# Patient Record
Sex: Male | Born: 1995 | Hispanic: Yes | Marital: Single
Health system: Southern US, Community
[De-identification: ages and names within clinical notes are randomized; demographics above are authoritative.]

## PROBLEM LIST (undated history)

## (undated) ENCOUNTER — Emergency Department (HOSPITAL_COMMUNITY): Admission: EM | Payer: Self-pay | Source: Home / Self Care

---

## 2020-04-16 ENCOUNTER — Emergency Department (HOSPITAL_COMMUNITY): Payer: Self-pay

## 2020-04-16 ENCOUNTER — Other Ambulatory Visit: Payer: Self-pay

## 2020-04-16 ENCOUNTER — Inpatient Hospital Stay (HOSPITAL_COMMUNITY)
Admission: EM | Admit: 2020-04-16 | Discharge: 2020-04-19 | DRG: 552 | Disposition: A | Discharge status: Home / Self Care | Payer: Self-pay | Attending: General Surgery | Admitting: General Surgery

## 2020-04-16 DIAGNOSIS — Y9269 Other specified industrial and construction area as the place of occurrence of the external cause: Secondary | ICD-10-CM

## 2020-04-16 DIAGNOSIS — S129XXA Fracture of neck, unspecified, initial encounter: Secondary | ICD-10-CM | POA: Diagnosis present

## 2020-04-16 DIAGNOSIS — Y929 Unspecified place or not applicable: Secondary | ICD-10-CM

## 2020-04-16 DIAGNOSIS — S0012XA Contusion of left eyelid and periocular area, initial encounter: Secondary | ICD-10-CM | POA: Diagnosis present

## 2020-04-16 DIAGNOSIS — S0230XA Fracture of orbital floor, unspecified side, initial encounter for closed fracture: Secondary | ICD-10-CM

## 2020-04-16 DIAGNOSIS — S0231XA Fracture of orbital floor, right side, initial encounter for closed fracture: Secondary | ICD-10-CM | POA: Diagnosis present

## 2020-04-16 DIAGNOSIS — S22039A Unspecified fracture of third thoracic vertebra, initial encounter for closed fracture: Secondary | ICD-10-CM | POA: Diagnosis present

## 2020-04-16 DIAGNOSIS — S12200A Unspecified displaced fracture of third cervical vertebra, initial encounter for closed fracture: Secondary | ICD-10-CM | POA: Diagnosis present

## 2020-04-16 DIAGNOSIS — J939 Pneumothorax, unspecified: Secondary | ICD-10-CM

## 2020-04-16 DIAGNOSIS — T1490XA Injury, unspecified, initial encounter: Secondary | ICD-10-CM

## 2020-04-16 DIAGNOSIS — S270XXA Traumatic pneumothorax, initial encounter: Secondary | ICD-10-CM | POA: Diagnosis present

## 2020-04-16 DIAGNOSIS — Y906 Blood alcohol level of 120-199 mg/100 ml: Secondary | ICD-10-CM | POA: Diagnosis present

## 2020-04-16 DIAGNOSIS — Z20822 Contact with and (suspected) exposure to covid-19: Secondary | ICD-10-CM | POA: Diagnosis present

## 2020-04-16 DIAGNOSIS — S22049A Unspecified fracture of fourth thoracic vertebra, initial encounter for closed fracture: Secondary | ICD-10-CM | POA: Diagnosis present

## 2020-04-16 DIAGNOSIS — S12000A Unspecified displaced fracture of first cervical vertebra, initial encounter for closed fracture: Principal | ICD-10-CM | POA: Diagnosis present

## 2020-04-16 DIAGNOSIS — S27322A Contusion of lung, bilateral, initial encounter: Secondary | ICD-10-CM | POA: Diagnosis present

## 2020-04-16 DIAGNOSIS — S22029A Unspecified fracture of second thoracic vertebra, initial encounter for closed fracture: Secondary | ICD-10-CM | POA: Diagnosis present

## 2020-04-16 DIAGNOSIS — S12100A Unspecified displaced fracture of second cervical vertebra, initial encounter for closed fracture: Secondary | ICD-10-CM | POA: Diagnosis present

## 2020-04-16 DIAGNOSIS — S02101A Fracture of base of skull, right side, initial encounter for closed fracture: Secondary | ICD-10-CM | POA: Diagnosis present

## 2020-04-16 DIAGNOSIS — F1010 Alcohol abuse, uncomplicated: Secondary | ICD-10-CM | POA: Diagnosis present

## 2020-04-16 LAB — COMPREHENSIVE METABOLIC PANEL
ALT: 30 U/L (ref 0–44)
AST: 74 U/L — ABNORMAL HIGH (ref 15–41)
Albumin: 4.4 g/dL (ref 3.5–5.0)
Alkaline Phosphatase: 74 U/L (ref 38–126)
Anion gap: 9 (ref 5–15)
BUN: 8 mg/dL (ref 6–20)
CO2: 23 mmol/L (ref 22–32)
Calcium: 8.6 mg/dL — ABNORMAL LOW (ref 8.9–10.3)
Chloride: 106 mmol/L (ref 98–111)
Creatinine, Ser: 0.96 mg/dL (ref 0.61–1.24)
GFR, Estimated: >60 mL/min (ref >60)
Glucose, Bld: 112 mg/dL — ABNORMAL HIGH (ref 70–99)
Potassium: 3.5 mmol/L (ref 3.5–5.1)
Sodium: 138 mmol/L (ref 135–145)
Total Bilirubin: 1.1 mg/dL (ref 0.3–1.2)
Total Protein: 7.1 g/dL (ref 6.5–8.1)

## 2020-04-16 LAB — CBC
HCT: 42.8 % (ref 39.0–52.0)
Hemoglobin: 14.7 g/dL (ref 13.0–17.0)
MCH: 33.3 pg (ref 26.0–34.0)
MCHC: 34.3 g/dL (ref 30.0–36.0)
MCV: 97.1 fL (ref 80.0–100.0)
Platelets: 262 10*3/uL (ref 150–400)
RBC: 4.41 MIL/uL (ref 4.22–5.81)
RDW: 12.4 % (ref 11.5–15.5)
WBC: 20 10*3/uL — ABNORMAL HIGH (ref 4.0–10.5)
nRBC: 0 % (ref 0.0–0.2)

## 2020-04-16 LAB — I-STAT CHEM 8, ED
BUN: 10 mg/dL (ref 6–20)
Calcium, Ion: 1.08 mmol/L — ABNORMAL LOW (ref 1.15–1.40)
Chloride: 106 mmol/L (ref 98–111)
Creatinine, Ser: 1 mg/dL (ref 0.61–1.24)
Glucose, Bld: 108 mg/dL — ABNORMAL HIGH (ref 70–99)
HCT: 42 % (ref 39.0–52.0)
Hemoglobin: 14.3 g/dL (ref 13.0–17.0)
Potassium: 3.8 mmol/L (ref 3.5–5.1)
Sodium: 142 mmol/L (ref 135–145)
TCO2: 24 mmol/L (ref 22–32)

## 2020-04-16 LAB — RESP PANEL BY RT-PCR (FLU A&B, COVID) ARPGX2
Influenza A by PCR: NEGATIVE
Influenza B by PCR: NEGATIVE
SARS Coronavirus 2 by RT PCR: NEGATIVE

## 2020-04-16 LAB — ETHANOL: Alcohol, Ethyl (B): 153 mg/dL — ABNORMAL HIGH (ref <10)

## 2020-04-16 LAB — PROTIME-INR
INR: 1 (ref 0.8–1.2)
Prothrombin Time: 12.9 seconds (ref 11.4–15.2)

## 2020-04-16 LAB — SAMPLE TO BLOOD BANK
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
## 2020-04-16 MED ORDER — FENTANYL CITRATE (PF) 100 MCG/2ML IJ SOLN
50.0000 ug | Freq: Once | INTRAMUSCULAR | Status: AC
  Administered 2020-04-16: 50 ug via INTRAVENOUS
  Filled 2020-04-16: qty 2

## 2020-04-16 MED ORDER — IOHEXOL 300 MG/ML  SOLN
100.0000 mL | Freq: Once | INTRAMUSCULAR | Status: AC | PRN
  Administered 2020-04-16: 100 mL via INTRAVENOUS

## 2020-04-16 MED ORDER — ONDANSETRON HCL 4 MG/2ML IJ SOLN
4.0000 mg | Freq: Once | INTRAMUSCULAR | Status: AC
  Administered 2020-04-16: 4 mg via INTRAVENOUS
  Filled 2020-04-16: qty 2

## 2020-04-16 NOTE — ED Notes (Signed)
Patient transported to CT 
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
## 2020-04-17 ENCOUNTER — Inpatient Hospital Stay (HOSPITAL_COMMUNITY): Payer: Self-pay

## 2020-04-17 ENCOUNTER — Emergency Department (HOSPITAL_COMMUNITY): Payer: Self-pay

## 2020-04-17 DIAGNOSIS — S129XXA Fracture of neck, unspecified, initial encounter: Secondary | ICD-10-CM | POA: Diagnosis present

## 2020-04-17 DIAGNOSIS — S0230XA Fracture of orbital floor, unspecified side, initial encounter for closed fracture: Secondary | ICD-10-CM

## 2020-04-17 LAB — BASIC METABOLIC PANEL
Anion gap: 9 (ref 5–15)
BUN: 8 mg/dL (ref 6–20)
CO2: 26 mmol/L (ref 22–32)
Calcium: 9 mg/dL (ref 8.9–10.3)
Chloride: 101 mmol/L (ref 98–111)
Creatinine, Ser: 0.72 mg/dL (ref 0.61–1.24)
GFR, Estimated: >60 mL/min (ref >60)
Glucose, Bld: 114 mg/dL — ABNORMAL HIGH (ref 70–99)
Potassium: 4 mmol/L (ref 3.5–5.1)
Sodium: 136 mmol/L (ref 135–145)

## 2020-04-17 LAB — CBC
HCT: 39.8 % (ref 39.0–52.0)
Hemoglobin: 13.9 g/dL (ref 13.0–17.0)
MCH: 33.6 pg (ref 26.0–34.0)
MCHC: 34.9 g/dL (ref 30.0–36.0)
MCV: 96.1 fL (ref 80.0–100.0)
Platelets: 215 10*3/uL (ref 150–400)
RBC: 4.14 MIL/uL — ABNORMAL LOW (ref 4.22–5.81)
RDW: 12.5 % (ref 11.5–15.5)
WBC: 13.5 10*3/uL — ABNORMAL HIGH (ref 4.0–10.5)
nRBC: 0 % (ref 0.0–0.2)

## 2020-04-17 LAB — HIV ANTIBODY (ROUTINE TESTING W REFLEX): HIV Screen 4th Generation wRfx: NONREACTIVE

## 2020-04-17 LAB — LACTIC ACID, PLASMA: Lactic Acid, Venous: 2.5 mmol/L (ref 0.5–1.9)

## 2020-04-17 LAB — MRSA PCR SCREENING: MRSA by PCR: NEGATIVE
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
## 2020-04-17 IMAGING — CT CT ANGIO NECK
2 of 7 series · 8 of 33 positions shown · IV contrast (APPLIED)
Comparison: Prior CT from [DATE].

CLINICAL DATA: Initial evaluation for acute cervical spine
fracture, value 8 for vascular injury.

EXAM:
CT ANGIOGRAPHY NECK
TECHNIQUE: Multidetector CT imaging of the neck was performed using the
standard protocol during bolus administration of intravenous
contrast. Multiplanar CT image reconstructions and MIPs were
obtained to evaluate the vascular anatomy. Carotid stenosis
measurements (when applicable) are obtained utilizing NASCET
criteria, using the distal internal carotid diameter as the
denominator.
CONTRAST:  60mL OMNIPAQUE IOHEXOL 350 MG/ML SOLN, 100mL OMNIPAQUE
IOHEXOL 300 MG/ML SOLN

[Series 13: cta neck · axial · 0.40mm/px · z∈[-284,-194]mm · 2 of 136 slices shown]
[im 46/136  soft-tissue]
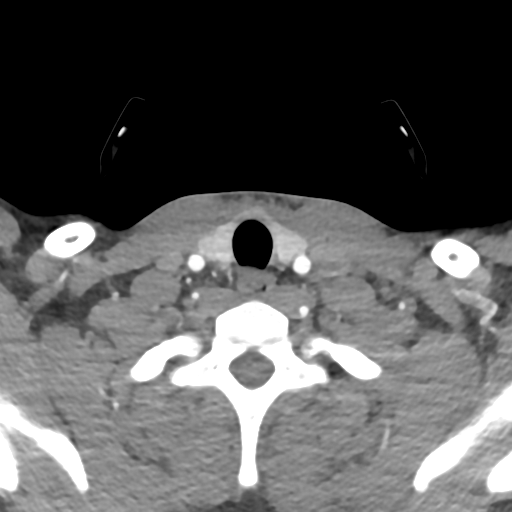
[im 91/136  soft-tissue]
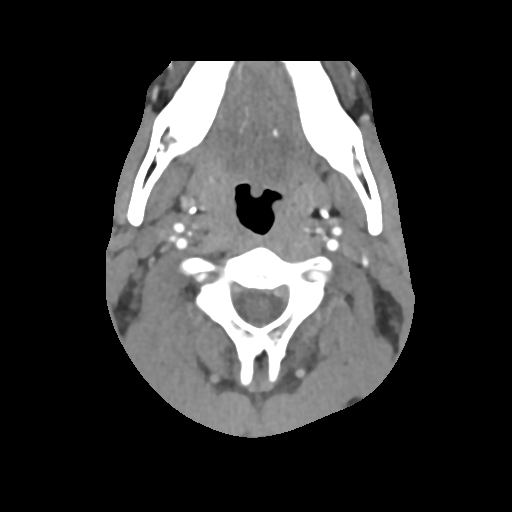

[Series 15: ax thins · axial · 0.39mm/px · z∈[-336,-144]mm · 6 of 270 slices shown]
[im 39/270  soft-tissue]
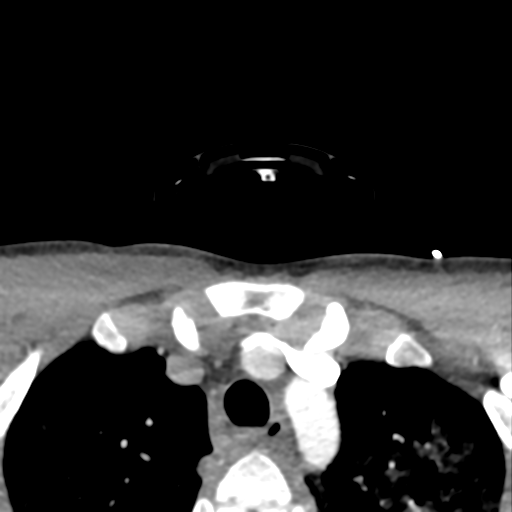
[im 77/270  bone]
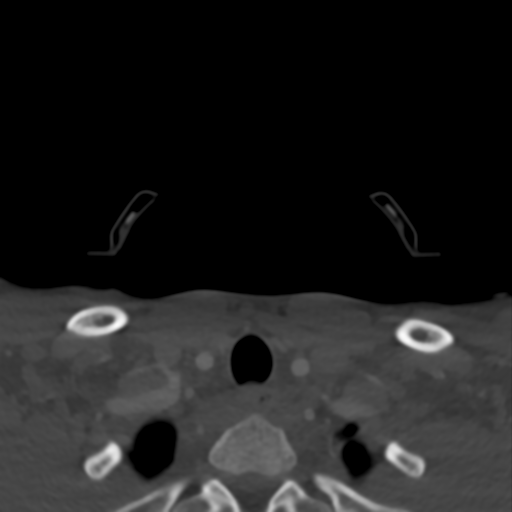
[im 116/270  soft-tissue]
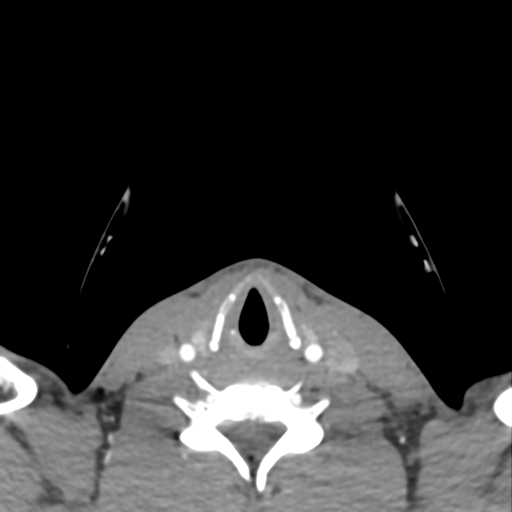
[im 154/270  bone]
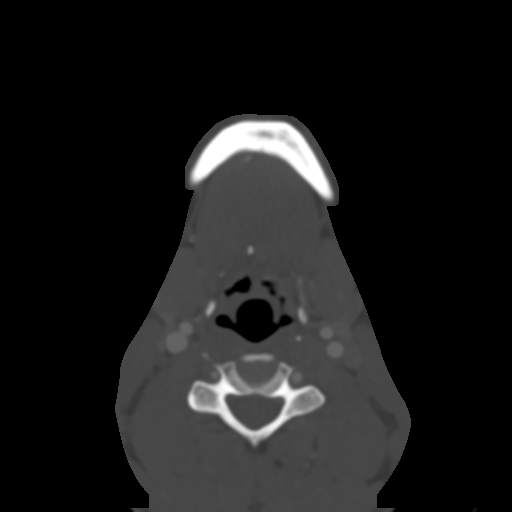
[im 193/270  soft-tissue]
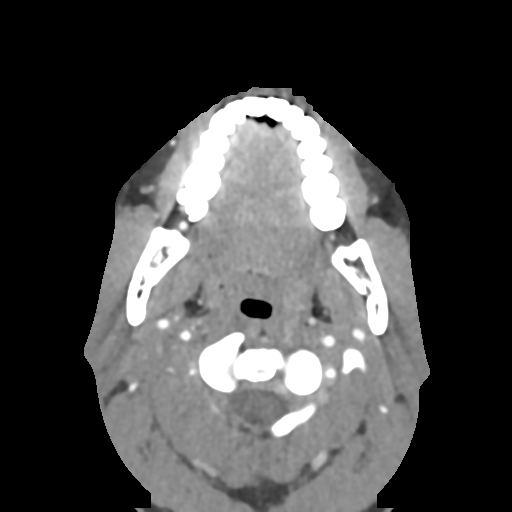
[im 231/270  bone]
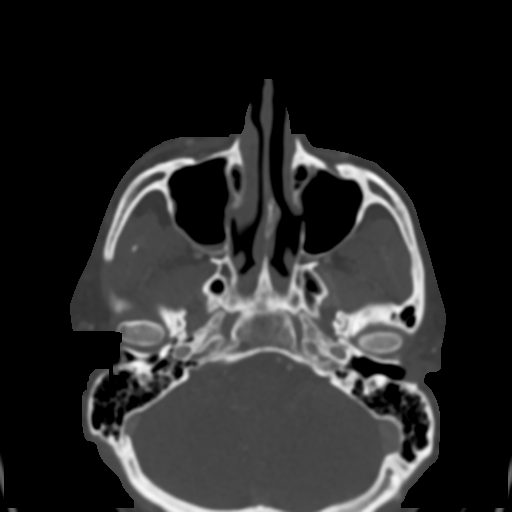

[8 of 33 positions shown; findings below may reference images not displayed]

FINDINGS: Aortic arch: Visualized aortic arch normal in caliber with normal
branch pattern. No stenosis or other acute abnormality about the
origin of the great vessels.

Right carotid system: Right common and internal carotid arteries
widely patent without stenosis, dissection or occlusion.

Left carotid system: Left common and internal carotid arteries
widely patent without stenosis, dissection or occlusion.

Vertebral arteries: Both vertebral arteries arise from the
subclavian arteries. No proximal subclavian artery stenosis.
Dominant left vertebral artery widely patent within the neck without
stenosis, dissection or occlusion. Proximal right V1 and V2 segments
widely patent without abnormality. At the distal right V2/V3
junction at the level of the right C2 transverse foramen there is a
focal 2 mm outpouching extending laterally and slightly posteriorly
from the right vertebral artery (series 15, image 92 on axial
sequence), suspicious for a small focal pseudoaneurysm. This is also
seen on corresponding coronal sequence (series 16, image 142) and
sagittal sequence (series 17, image 79). No significant luminal
narrowing or intimal irregularity seen at this level. The right
vertebral artery is otherwise patent distally without abnormality.

Skeleton: Right-sided cervical and skull base fractures noted,
better evaluated on prior CT. Upper thoracic spinal fractures also
better evaluated on prior.

Other neck: No other acute soft tissue abnormality within the neck.
No mass or adenopathy.

Upper chest: Trace bilateral apical pneumothoraces again noted.
Extensive pulmonary contusion throughout the visualized left greater
than right lungs. Findings better evaluated on prior chest CT.
IMPRESSION: 1. Focal 2 mm outpouching extending laterally and slightly
posteriorly from the distal right V2/V3 junction, suspicious for a
small posttraumatic pseudoaneurysm.
2. No other acute traumatic injury to the major arterial vasculature
of the neck.
3. Right-sided cervical and skull base fractures with upper thoracic
spinal fractures, better evaluated on prior CT.
4. Trace bilateral apical pneumothoraces with extensive pulmonary
contusion throughout the visualized left greater than right lungs,
better evaluated on prior chest CT.
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
## 2020-04-17 MED ORDER — ENOXAPARIN SODIUM 30 MG/0.3ML ~~LOC~~ SOLN
30.0000 mg | Freq: Two times a day (BID) | SUBCUTANEOUS | Status: DC
  Administered 2020-04-18 – 2020-04-19 (×3): 30 mg via SUBCUTANEOUS
  Filled 2020-04-17 (×3): qty 0.3

## 2020-04-17 MED ORDER — IOHEXOL 350 MG/ML SOLN
100.0000 mL | Freq: Once | INTRAVENOUS | Status: AC | PRN
  Administered 2020-04-17: 60 mL via INTRAVENOUS

## 2020-04-17 MED ORDER — LORAZEPAM 1 MG PO TABS: 1.0000 mg | ORAL_TABLET | ORAL | Status: DC | PRN

## 2020-04-17 MED ORDER — THIAMINE HCL 100 MG PO TABS
100.0000 mg | ORAL_TABLET | Freq: Every day | ORAL | Status: DC
  Administered 2020-04-17 – 2020-04-19 (×3): 100 mg via ORAL
  Filled 2020-04-17 (×3): qty 1

## 2020-04-17 MED ORDER — OXYCODONE HCL 5 MG PO TABS
10.0000 mg | ORAL_TABLET | ORAL | Status: DC | PRN
  Administered 2020-04-17 – 2020-04-19 (×9): 10 mg via ORAL
  Filled 2020-04-17 (×9): qty 2

## 2020-04-17 MED ORDER — LORAZEPAM 2 MG/ML IJ SOLN: 1.0000 mg | INTRAMUSCULAR | Status: DC | PRN

## 2020-04-17 MED ORDER — LACTATED RINGERS IV SOLN: INTRAVENOUS | Status: DC

## 2020-04-17 MED ORDER — ADULT MULTIVITAMIN W/MINERALS CH
1.0000 | ORAL_TABLET | Freq: Every day | ORAL | Status: DC
  Administered 2020-04-17 – 2020-04-19 (×3): 1 via ORAL
  Filled 2020-04-17 (×3): qty 1

## 2020-04-17 MED ORDER — ACETAMINOPHEN 325 MG PO TABS
650.0000 mg | ORAL_TABLET | ORAL | Status: DC | PRN
  Administered 2020-04-17 – 2020-04-18 (×3): 650 mg via ORAL
  Filled 2020-04-17 (×3): qty 2

## 2020-04-17 MED ORDER — HYDROMORPHONE HCL 1 MG/ML IJ SOLN
0.5000 mg | INTRAMUSCULAR | Status: DC | PRN
  Administered 2020-04-17 – 2020-04-19 (×9): 0.5 mg via INTRAVENOUS
  Filled 2020-04-17 (×8): qty 1

## 2020-04-17 MED ORDER — THIAMINE HCL 100 MG/ML IJ SOLN
100.0000 mg | Freq: Every day | INTRAMUSCULAR | Status: DC
  Filled 2020-04-17: qty 2

## 2020-04-17 MED ORDER — LORAZEPAM 2 MG/ML IJ SOLN
0.0000 mg | Freq: Two times a day (BID) | INTRAMUSCULAR | Status: DC
Start: 2020-04-19 — End: 2020-04-19

## 2020-04-17 MED ORDER — ONDANSETRON HCL 4 MG/2ML IJ SOLN
4.0000 mg | Freq: Four times a day (QID) | INTRAMUSCULAR | Status: DC | PRN
  Administered 2020-04-17 (×2): 4 mg via INTRAVENOUS
  Filled 2020-04-17 (×2): qty 2

## 2020-04-17 MED ORDER — LORAZEPAM 2 MG/ML IJ SOLN
0.0000 mg | Freq: Four times a day (QID) | INTRAMUSCULAR | Status: DC
Start: 2020-04-17 — End: 2020-04-19

## 2020-04-17 MED ORDER — OXYCODONE HCL 5 MG PO TABS
5.0000 mg | ORAL_TABLET | ORAL | Status: DC | PRN
  Filled 2020-04-17 (×2): qty 1

## 2020-04-17 MED ORDER — FOLIC ACID 1 MG PO TABS
1.0000 mg | ORAL_TABLET | Freq: Every day | ORAL | Status: DC
  Administered 2020-04-17 – 2020-04-19 (×3): 1 mg via ORAL
  Filled 2020-04-17 (×3): qty 1

## 2020-04-17 MED ORDER — ONDANSETRON 4 MG PO TBDP: 4.0000 mg | ORAL_TABLET | Freq: Four times a day (QID) | ORAL | Status: DC | PRN

## 2020-04-17 NOTE — ED Notes (Signed)
Critical lactic reported to Bernette Mayers MD

## 2020-04-17 NOTE — Progress Notes (Signed)
Complaining of bilateral knee pain without able to lift his left foot off bed due to pain.  Sensation, pulses intact.  Will check MRI for ligamentous injury.
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
## 2020-04-18 ENCOUNTER — Other Ambulatory Visit (HOSPITAL_COMMUNITY): Payer: Self-pay

## 2020-04-18 MED ORDER — SALINE SPRAY 0.65 % NA SOLN
1.0000 | NASAL | Status: DC | PRN
  Filled 2020-04-18: qty 44

## 2020-04-18 MED ORDER — ASPIRIN 325 MG PO TABS
325.0000 mg | ORAL_TABLET | Freq: Every day | ORAL | Status: DC
  Administered 2020-04-19: 325 mg via ORAL
  Filled 2020-04-18: qty 1

## 2020-04-18 MED ORDER — ASPIRIN 325 MG PO TBEC
325.0000 mg | DELAYED_RELEASE_TABLET | Freq: Every day | ORAL | 2 refills | Status: AC
  Filled 2020-04-18: qty 30, 30d supply, fill #0

## 2020-04-18 MED ORDER — SALINE SPRAY 0.65 % NA SOLN: 1.0000 | NASAL | 0 refills | Status: AC | PRN

## 2020-04-18 MED ORDER — OXYCODONE HCL 10 MG PO TABS
5.0000 mg | ORAL_TABLET | Freq: Four times a day (QID) | ORAL | 0 refills | Status: AC | PRN
  Filled 2020-04-18: qty 25, 7d supply, fill #0

## 2020-04-18 MED ORDER — ACETAMINOPHEN 325 MG PO TABS: 650.0000 mg | ORAL_TABLET | Freq: Four times a day (QID) | ORAL | Status: DC | PRN
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
## 2020-04-18 NOTE — Discharge Instructions (Signed)
Skull base, Cervical spine 1/2/3 Fractures  Wear neck collar at all times, follow up with Dr. Franky Macho  Thoracic spine 2/3/4 Fracture  You do not have to wear a brace Follow up with Dr. Franky Macho  Blunt cerebrovascular injury: Right vertebral pseudoaneurysm  Take daily aspirin 325mg  Follow up with Dr.  Right orbital floor Fracture (facial fracture)  No nose blowing for 3 weeks Use saline spray as needed for congestion Follow up with Dr. Franky Macho  Bilateral knee soft tissue injuries  No ligamentous injury on MRIs Hinged knee brace as needed if having trouble mobilizing Pain should resolve with time, if it does not call orthopedics for appointment   How to Use a Hard Cervical Collar A hard cervical collar holds your head and neck in a straight position and limits the movement of the top part of your spine (cervical spine). A cervical collar may be used to treat:  A fracture in the neck.  Damage to the tissues that connect bones to each other (ligaments).  An injury to the spinal cord. A health care provider will check if it is safe for you to go home with a hard cervical collar. There are several types of hard cervical collars. Make sure you follow all instructions from your health care provider and the manufacturer's instructions for use. These are general guidelines. What are the risks? Wearing a cervical collar is safe. However, problems may occur, including:  Skin breakdown.  Sores that form due to rubbing or pressure on the skin (pressure injuries).  Pain.  Difficulty breathing.  Worsening of your condition if the collar is not placed correctly.  Increased risk of inhaling food or liquid into your lungs (aspiration). Supplies needed:  Extra cervical collar and replacement pads.  Ice.  Plastic bag.  Towel.  A watertight covering to put over the collar during bathing, if needed. How to use a hard cervical collar  Wear the hard cervical collar as told by  your health care provider. Do not remove it unless told to do so by your health care provider.  You may be directed to remove it only when you check your skin and change the pads. While the collar is off: ? Ask another person to assist you if needed. ? Keep your head and neck straight.  Do not bend your neck or turn your head. ? Check your skin daily for red areas. Ask for help or use a mirror to check areas you cannot see. ? After checking your skin, wear the extra cervical collar while cleaning and changing the pads of the other collar.  Change the pads daily or more often if they become wet or dirty. Keep a clean set of replacement pads.  Do not let hard plastic edges touch your skin. Cover them with a soft pad.  If your cervical collar is not waterproof: ? Do not let it get wet. ? Cover it with a watertight covering when you take a bath or a shower. Follow these instructions at home:  If directed, put ice on the injured area. To do this: ? Do not remove your cervical collar unless told to do so by your health care provider. ? Put ice in a plastic bag. ? Place a towel between your skin and the bag or between your cervical collar and the bag. ? Leave the ice on for 20 minutes, 2-3 times a day for the first 2 days after your injury.  Do not drive until your health care  provider says it is okay.  Keep all follow-up visits. This is important. Any delay in getting the care that you need can prevent proper healing of your injury. Contact a health care provider if you have:  Red areas of skin under your cervical collar.  Pain that is not controlled with your medicines.  Any questions about how to use or care for your collar. Get help right away if you have:  Numbness or weakness in your arms or legs.  Difficulty breathing. These symptoms may represent a serious problem that is an emergency. Do not wait to see if the symptoms will go away. Get medical help right away. Call your local  emergency services (911 in the [place] Do not drive yourself to the hospital. Summary  A hard cervical collar holds your head and neck in a straight position and limits the movement of the top part of your spine (cervical spine).  A cervical collar may be used to treat a fracture in the neck, damage to the tissues that connect bones to each other (ligaments), or an injury to the spinal cord.  Wear the cervical collar as told by your health care provider. Do not remove it unless told to do so by your health care provider. This information is not intended to replace advice given to you by your health care provider. Make sure you discuss any questions you have with your health care provider. Document Revised: 03/25/2019 Document Reviewed: 03/25/2019 Elsevier Patient Education  2021 [employer]    Pulmonary Contusion, Adult A pulmonary contusion is a deep bruise to the lung. The bruise causes the lung tissue to swell and bleed into the area around it. The lung may not work well if this happens. You may find it hard to breathe, and you may have pain in your chest. What are the causes? This condition is usually caused by a chest injury. This may happen from:  A car crash.  A very bad fall, especially from a great height.  Being near an explosion.  A sports injury.  Being crushed, such as by machinery.  Being hit in the chest.  An injury that goes through your skin. What are the signs or symptoms?  Having a hard time breathing.  Fast breathing.  Fast heart rate.  Bruising of the chest.  Chest pain, especially when taking a deep breath.  Coughing.  Spitting up blood.  Fever. Symptoms may get worse over the first 24-48 hours. How is this treated? Treatment for this condition may include:  Taking pain medicine.  Doing breathing exercises, such as deep breathing and coughing to help avoid a lung infection (pneumonia).  Having oxygen therapy if you have a low oxygen  level.  Staying in the hospital to make sure your symptoms do not get worse.  Having surgery to stop the bleeding if your lung has been punctured.  Having a tube placed in your throat and using a machine (ventilator) to help you breathe. This may be needed in very bad cases. Follow these instructions at home: Medicines  Do not take aspirin for the first few days because this may increase bruising.  Take over-the-counter and prescription medicines only as told by your doctor.  Ask your doctor if the medicine prescribed to you requires you to avoid driving or using heavy machinery. Activity  Ask your doctor what activities are safe for you. Ask your doctor if it is safe for you to drive, go to work, go to school, and  play sports.  Return to your normal activities as told by your doctor. General instructions  Take deep breaths and cough to clear your lungs of mucus.  Hold a pillow to your chest if it hurts when you cough.  Do breathing exercises with an incentive spirometer as told by your doctor.  Keep all follow-up visits as told by your doctor. This is important.   Contact a doctor if:  Any of your symptoms get worse or do not get better. Get help right away if:  You have trouble breathing.  Your chest pain gets worse.  You cough up blood.  You make high-pitched whistling sounds when you breathe (wheeze).  Your lips or nails look blue.  You feel dizzy, or you feel like you may pass out (faint).  You feel mixed up (confused).  You have a fever. Summary  A pulmonary contusion is a deep bruise to the lung. The lung may not work well if this happens.  Your symptoms may get worse over the first 24-48 hours.  Take deep breaths and cough to clear your lungs of mucus.  Ask your doctor what activities are safe for you. Ask your doctor if it is safe for you to drive, go to work, go to school, and play sports. This information is not intended to replace advice given to  you by your health care provider. Make sure you discuss any questions you have with your health care provider. Document Revised: 11/19/2017 Document Reviewed: 11/20/2017 Elsevier Patient Education  2021 [employer].

## 2020-04-18 NOTE — Plan of Care (Signed)
  Problem: Education: Goal: Knowledge of General Education information will improve Description: Including pain rating scale, medication(s)/side effects and non-pharmacologic comfort measures Outcome: Progressing   Problem: Clinical Measurements: Goal: Ability to maintain clinical measurements within normal limits will improve Outcome: Progressing Goal: Will remain free from infection Outcome: Progressing Goal: Diagnostic test results will improve Outcome: Progressing Goal: Respiratory complications will improve Outcome: Progressing Goal: Cardiovascular complication will be avoided Outcome: Progressing   Problem: Health Behavior/Discharge Planning: Goal: Ability to manage health-related needs will improve Outcome: Progressing   Problem: Activity: Goal: Risk for activity intolerance will decrease Outcome: Progressing   Problem: Elimination: Goal: Will not experience complications related to bowel motility Outcome: Progressing Goal: Will not experience complications related to urinary retention Outcome: Progressing   Problem: Pain Managment: Goal: General experience of comfort will improve Outcome: Progressing   Problem: Safety: Goal: Ability to remain free from injury will improve Outcome: Progressing   Problem: Skin Integrity: Goal: Risk for impaired skin integrity will decrease Outcome: Progressing   

## 2020-04-18 NOTE — TOC CAGE-AID Note (Signed)
Transition of Care [facility]) - CAGE-AID Screening   Patient Details  Name: Chris Owens MRN: [id number] Date of Birth: Oct 24, 1995    Hewitt Shorts, RN Trauma Response Nurse Phone Number: 04/18/2020, 12:17 PM     CAGE-AID Screening:    Have You Ever Felt You Ought to Cut Down on Your Drinking or Drug Use?: No Have People Annoyed You By [employer] Your Drinking Or Drug Use?: No Have You Felt Bad Or Guilty About Your Drinking Or Drug Use?: No Have You Ever Had a Drink or Used Drugs First Thing In The Morning to Steady Your Nerves or to Get Rid of a Hangover?: No CAGE-AID Score: 0  Substance Abuse Education Offered: No (pt states that he drinks "2 tall boys after work" and uses marijuana- does not want any resources)

## 2020-04-19 ENCOUNTER — Other Ambulatory Visit (HOSPITAL_COMMUNITY): Payer: Self-pay

## 2020-04-19 MED ORDER — METHOCARBAMOL 750 MG PO TABS
750.0000 mg | ORAL_TABLET | Freq: Three times a day (TID) | ORAL | 0 refills | Status: AC
  Filled 2020-04-19: qty 60, 20d supply, fill #0

## 2020-04-19 MED ORDER — ACETAMINOPHEN 325 MG PO TABS
650.0000 mg | ORAL_TABLET | Freq: Four times a day (QID) | ORAL | Status: DC
  Administered 2020-04-19: 650 mg via ORAL
  Filled 2020-04-19: qty 2

## 2020-04-19 MED ORDER — ACETAMINOPHEN 325 MG PO TABS: 650.0000 mg | ORAL_TABLET | Freq: Four times a day (QID) | ORAL | Status: AC | PRN

## 2020-04-19 MED ORDER — METHOCARBAMOL 750 MG PO TABS
750.0000 mg | ORAL_TABLET | Freq: Three times a day (TID) | ORAL | Status: DC
  Administered 2020-04-19: 750 mg via ORAL
  Filled 2020-04-19: qty 1
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
## 2020-04-20 NOTE — ED Notes (Addendum)
Pt repeatedly needing to be log rolled to vomit. Pt frustrated with having to lay flat, having to repeatedly remind pt the importance of remaining supine to protect him from further injury.

## 2020-04-20 NOTE — ED Notes (Signed)
Pt reported to have vomited in CT. Pt was log rolled to side to protect cspine.
